# Patient Record
Sex: Female | Born: 1990 | Race: White | Hispanic: No | Marital: Married | Smoking: Never smoker
Health system: Southern US, Community
[De-identification: ages and names within clinical notes are randomized; demographics above are authoritative.]

## PROBLEM LIST (undated history)

## (undated) ENCOUNTER — Inpatient Hospital Stay (HOSPITAL_COMMUNITY): Payer: Self-pay

## (undated) DIAGNOSIS — E119 Type 2 diabetes mellitus without complications: Secondary | ICD-10-CM

## (undated) HISTORY — PX: SKIN GRAFT: SHX250

---

## 2008-02-22 ENCOUNTER — Emergency Department (HOSPITAL_COMMUNITY): Admission: EM | Admit: 2008-02-22 | Discharge: 2008-02-22 | Payer: Self-pay | Admitting: Emergency Medicine

## 2016-05-17 ENCOUNTER — Inpatient Hospital Stay (HOSPITAL_COMMUNITY)
Admission: AD | Admit: 2016-05-17 | Discharge: 2016-05-17 | Disposition: A | Discharge status: Home / Self Care | Payer: Self-pay | Source: Ambulatory Visit | Attending: Obstetrics and Gynecology | Admitting: Obstetrics and Gynecology

## 2016-05-17 ENCOUNTER — Inpatient Hospital Stay (HOSPITAL_COMMUNITY): Payer: Self-pay

## 2016-05-17 ENCOUNTER — Encounter (HOSPITAL_COMMUNITY): Payer: Self-pay | Admitting: *Deleted

## 2016-05-17 DIAGNOSIS — O034 Incomplete spontaneous abortion without complication: Secondary | ICD-10-CM | POA: Insufficient documentation

## 2016-05-17 DIAGNOSIS — O209 Hemorrhage in early pregnancy, unspecified: Secondary | ICD-10-CM

## 2016-05-17 LAB — URINALYSIS, ROUTINE W REFLEX MICROSCOPIC
BILIRUBIN URINE: NEGATIVE
Glucose, UA: NEGATIVE mg/dL
KETONES UR: NEGATIVE mg/dL
LEUKOCYTES UA: NEGATIVE
NITRITE: NEGATIVE
Protein, ur: NEGATIVE mg/dL
SPECIFIC GRAVITY, URINE: 1.02 (ref 1.005–1.030)
pH: 6 (ref 5.0–8.0)

## 2016-05-17 LAB — URINE MICROSCOPIC-ADD ON

## 2016-05-17 LAB — POCT PREGNANCY, URINE: PREG TEST UR: POSITIVE — AB

## 2016-05-17 LAB — HCG, QUANTITATIVE, PREGNANCY: hCG, Beta Chain, Quant, S: 3656 m[IU]/mL — ABNORMAL HIGH (ref <5)

## 2016-05-17 NOTE — MAU Note (Signed)
Pt returned from U/S. Small gel like sac noted on pad  From U/s. Possible gestational sac. Dr. Cherly Hensenousins observed it and sent to pathology.

## 2016-05-17 NOTE — MAU Note (Signed)
Pt reports she started having some vaginal bleeding when she wiped an some spotting . reports some mild cramping as well.

## 2016-05-17 NOTE — MAU Provider Note (Signed)
History     Chief Complaint  Patient presents with  . Vaginal Bleeding   25 yo G1P0 MWF LMP 04/05/2016 (+) SPT 6 week amenorrhea here for evaluation of vaginal bleeding that started today. Mild cramping.   OB History    Gravida Para Term Preterm AB Living   1             SAB TAB Ectopic Multiple Live Births                  Past Medical History:  Diagnosis Date  . Medical history non-contributory     Past Surgical History:  Procedure Laterality Date  . SKIN GRAFT     mouth    No family history on file.  Social History  Substance Use Topics  . Smoking status: Never Smoker  . Smokeless tobacco: Never Used  . Alcohol use No    Allergies: Allergies not on file  No prescriptions prior to admission.     Physical Exam   Blood pressure 132/73, pulse 99, temperature 99.1 F (37.3 C), resp. rate 18, height 5\' 5"  (1.651 m), weight 51.3 kg (113 lb), last menstrual period 04/05/2016.  General appearance: alert, cooperative and no distress Lungs: clear to auscultation bilaterally Heart: regular rate and rhythm Abdomen: soft, non-tender; bowel sounds normal; no masses,  no organomegaly Pelvic: cervix normal in appearance, external genitalia normal, no adnexal masses or tenderness, no bladder tenderness, uterus normal size, shape, and consistency and cervix closed, small amount of blood in vault Extremities: no edema, redness or tenderness in the calves or thighs Skin: Skin color, texture, turgor normal. No rashes or lesions ED Course  Vaginal bleeding In pregnancy P) Ob sonogram . Blood type MDM   Ileana Chalupa A, MD 7:34 PM 05/17/2016  Addendum: pt returned from sonogram having passed ? Gest sac and having had increased bleeding in the U/s dept.  Blood type A positive sono done: US Ob Comp Less 14 Wks  Result Date: 05/17/2016 CLINICAL DATA:  Vaginal bleeding. Estimated gestational age per LMP 6 weeks 0 days. No pregnancy test or quantitative beta HCG reported.  EXAM: OBSTETRIC <14 WK [place] AND TRANSVAGINAL OB US TECHNIQUE: Both transabdominal and transvaginal ultrasound examinations were performed for complete evaluation of the gestation as well as the maternal uterus, adnexal regions, and pelvic cul-de-sac. Transvaginal technique was performed to assess early pregnancy. COMPARISON:  None. FINDINGS: Intrauterine gestational sac: Not visualized. Yolk sac:  Not visualized. Embryo:  Not visualized. Cardiac Activity: Not visualized. Heart Rate: Not visualized. MSD: Not visualized. CRL:  Not visualized. Subchorionic hemorrhage:  None visualized. Maternal uterus/adnexae: Uterus is normal in size, shape and position as the endometrium measures 12.2 mm in thickness. There is moderate mobile heterogeneous debris within the endometrial cavity extending to the endocervical canal likely hemorrhagic debris. Patient passed moderate clots between the transabdominal and endovaginal scanning. No free pelvic fluid. Ovaries are within normal with normal color Doppler flow. IMPRESSION: No evidence of intrauterine gestation. Moderate heterogeneous mobile debris within the endometrial and endocervical canal compatible with hemorrhagic debris. Normal ovaries.  No free fluid. Electronically Signed   By: Elberta Fortis M.D.   On: 05/17/2016 20:10   US Ob Transvaginal  Result Date: 05/17/2016 CLINICAL DATA:  Vaginal bleeding. Estimated gestational age per LMP 6 weeks 0 days. No pregnancy test or quantitative beta HCG reported. EXAM: OBSTETRIC <14 WK [place] AND TRANSVAGINAL OB US TECHNIQUE: Both transabdominal and transvaginal ultrasound examinations were performed for complete evaluation of the gestation  as well as the maternal uterus, adnexal regions, and pelvic cul-de-sac. Transvaginal technique was performed to assess early pregnancy. COMPARISON:  None. FINDINGS: Intrauterine gestational sac: Not visualized. Yolk sac:  Not visualized. Embryo:  Not visualized. Cardiac Activity: Not visualized. Heart  Rate: Not visualized. MSD: Not visualized. CRL:  Not visualized. Subchorionic hemorrhage:  None visualized. Maternal uterus/adnexae: Uterus is normal in size, shape and position as the endometrium measures 12.2 mm in thickness. There is moderate mobile heterogeneous debris within the endometrial cavity extending to the endocervical canal likely hemorrhagic debris. Patient passed moderate clots between the transabdominal and endovaginal scanning. No free pelvic fluid. Ovaries are within normal with normal color Doppler flow. IMPRESSION: No evidence of intrauterine gestation. Moderate heterogeneous mobile debris within the endometrial and endocervical canal compatible with hemorrhagic debris. Normal ovaries.  No free fluid. Electronically Signed   By: Elberta Fortisaniel  Boyle M.D.   On: 05/17/2016 20:10    Will add hquant.  Specimen sent to pathology counseled on SAB precautions. To expect more bleeding Will notify of Hquant result. Disc abstaining and following up in office. Heartstring info given D/c home

## 2016-05-18 LAB — ABO/RH: ABO/RH(D): A POS

## 2016-09-01 NOTE — L&D Delivery Note (Signed)
Delivery Note At 11:13 AM a viable and healthy female was delivered via  (Presentation: ;LOA  ).  APGAR: 8, 9; weight pending .   Placenta status: spontaneious, intact.  Cord:  with the following complications: none.  Cord pH: na  Anesthesia:  epidural Episiotomy:  na Lacerations:  second Suture Repair: 2.0 3.0 vicryl rapide Est. Blood Loss (mL):  300  Mom to postpartum.  Baby to Couplet care / Skin to Skin.  Martha Delgado J 03/23/2017, 12:21 PM

## 2016-09-19 LAB — OB RESULTS CONSOLE RUBELLA ANTIBODY, IGM: RUBELLA: IMMUNE

## 2016-09-19 LAB — OB RESULTS CONSOLE GC/CHLAMYDIA
Chlamydia: NEGATIVE
Gonorrhea: NEGATIVE

## 2016-09-19 LAB — OB RESULTS CONSOLE HIV ANTIBODY (ROUTINE TESTING): HIV: NONREACTIVE

## 2016-09-19 LAB — OB RESULTS CONSOLE ABO/RH: RH TYPE: POSITIVE

## 2016-09-19 LAB — OB RESULTS CONSOLE RPR: RPR: NONREACTIVE

## 2016-09-19 LAB — OB RESULTS CONSOLE HEPATITIS B SURFACE ANTIGEN: HEP B S AG: NEGATIVE

## 2016-09-19 LAB — OB RESULTS CONSOLE ANTIBODY SCREEN: Antibody Screen: NEGATIVE

## 2017-01-14 ENCOUNTER — Ambulatory Visit: Payer: Self-pay

## 2017-02-24 LAB — OB RESULTS CONSOLE GBS: GBS: NEGATIVE

## 2017-03-17 ENCOUNTER — Encounter (HOSPITAL_COMMUNITY): Payer: Self-pay | Admitting: *Deleted

## 2017-03-17 ENCOUNTER — Telehealth (HOSPITAL_COMMUNITY): Payer: Self-pay | Admitting: *Deleted

## 2017-03-17 ENCOUNTER — Other Ambulatory Visit: Payer: Self-pay | Admitting: Obstetrics

## 2017-03-17 NOTE — Telephone Encounter (Signed)
Preadmission screen  

## 2017-03-18 ENCOUNTER — Encounter (HOSPITAL_COMMUNITY): Payer: Self-pay | Admitting: *Deleted

## 2017-03-18 ENCOUNTER — Telehealth (HOSPITAL_COMMUNITY): Payer: Self-pay | Admitting: *Deleted

## 2017-03-18 NOTE — Telephone Encounter (Signed)
Preadmission screen  

## 2017-03-22 ENCOUNTER — Encounter (HOSPITAL_COMMUNITY): Payer: Self-pay

## 2017-03-23 ENCOUNTER — Inpatient Hospital Stay (HOSPITAL_COMMUNITY): Payer: Self-pay | Admitting: Anesthesiology

## 2017-03-23 ENCOUNTER — Encounter (HOSPITAL_COMMUNITY): Payer: Self-pay | Admitting: *Deleted

## 2017-03-23 ENCOUNTER — Inpatient Hospital Stay (HOSPITAL_COMMUNITY)
Admission: RE | Admit: 2017-03-23 | Discharge: 2017-03-24 | DRG: 775 | Disposition: A | Discharge status: Home / Self Care | Payer: Self-pay | Source: Ambulatory Visit | Attending: Obstetrics and Gynecology | Admitting: Obstetrics and Gynecology

## 2017-03-23 DIAGNOSIS — Z880 Allergy status to penicillin: Secondary | ICD-10-CM

## 2017-03-23 DIAGNOSIS — O24410 Gestational diabetes mellitus in pregnancy, diet controlled: Secondary | ICD-10-CM | POA: Diagnosis present

## 2017-03-23 DIAGNOSIS — O480 Post-term pregnancy: Secondary | ICD-10-CM | POA: Diagnosis present

## 2017-03-23 DIAGNOSIS — Z3490 Encounter for supervision of normal pregnancy, unspecified, unspecified trimester: Secondary | ICD-10-CM | POA: Diagnosis present

## 2017-03-23 DIAGNOSIS — Z3A40 40 weeks gestation of pregnancy: Secondary | ICD-10-CM

## 2017-03-23 DIAGNOSIS — D509 Iron deficiency anemia, unspecified: Secondary | ICD-10-CM | POA: Diagnosis present

## 2017-03-23 DIAGNOSIS — O9902 Anemia complicating childbirth: Secondary | ICD-10-CM | POA: Diagnosis present

## 2017-03-23 DIAGNOSIS — O24420 Gestational diabetes mellitus in childbirth, diet controlled: Principal | ICD-10-CM | POA: Diagnosis present

## 2017-03-23 HISTORY — DX: Type 2 diabetes mellitus without complications: E11.9

## 2017-03-23 LAB — GLUCOSE, CAPILLARY
GLUCOSE-CAPILLARY: 84 mg/dL (ref 65–99)
GLUCOSE-CAPILLARY: 95 mg/dL (ref 65–99)

## 2017-03-23 LAB — CBC
HEMATOCRIT: 37.4 % (ref 36.0–46.0)
HEMOGLOBIN: 13.1 g/dL (ref 12.0–15.0)
MCH: 32.1 pg (ref 26.0–34.0)
MCHC: 35 g/dL (ref 30.0–36.0)
MCV: 91.7 fL (ref 78.0–100.0)
Platelets: 205 10*3/uL (ref 150–400)
RBC: 4.08 MIL/uL (ref 3.87–5.11)
RDW: 13.6 % (ref 11.5–15.5)
WBC: 8.1 10*3/uL (ref 4.0–10.5)

## 2017-03-23 LAB — TYPE AND SCREEN
ABO/RH(D): A POS
ANTIBODY SCREEN: NEGATIVE

## 2017-03-23 MED ORDER — EPHEDRINE 5 MG/ML INJ: 10.0000 mg | INTRAVENOUS | Status: DC | PRN

## 2017-03-23 MED ORDER — PHENYLEPHRINE 40 MCG/ML (10ML) SYRINGE FOR IV PUSH (FOR BLOOD PRESSURE SUPPORT)
80.0000 ug | PREFILLED_SYRINGE | INTRAVENOUS | Status: DC | PRN
  Filled 2017-03-23: qty 5

## 2017-03-23 MED ORDER — LIDOCAINE HCL (PF) 1 % IJ SOLN
30.0000 mL | INTRAMUSCULAR | Status: DC | PRN
  Filled 2017-03-23: qty 30

## 2017-03-23 MED ORDER — FENTANYL 2.5 MCG/ML BUPIVACAINE 1/10 % EPIDURAL INFUSION (WH - ANES)
INTRAMUSCULAR | Status: AC
  Filled 2017-03-23: qty 100

## 2017-03-23 MED ORDER — OXYCODONE-ACETAMINOPHEN 5-325 MG PO TABS: 1.0000 | ORAL_TABLET | ORAL | Status: DC | PRN

## 2017-03-23 MED ORDER — LACTATED RINGERS IV SOLN: 500.0000 mL | INTRAVENOUS | Status: DC | PRN

## 2017-03-23 MED ORDER — PRENATAL MULTIVITAMIN CH
1.0000 | ORAL_TABLET | Freq: Every day | ORAL | Status: DC
  Administered 2017-03-24: 1 via ORAL
  Filled 2017-03-23: qty 1

## 2017-03-23 MED ORDER — ACETAMINOPHEN 325 MG PO TABS: 650.0000 mg | ORAL_TABLET | ORAL | Status: DC | PRN

## 2017-03-23 MED ORDER — METHYLERGONOVINE MALEATE 0.2 MG/ML IJ SOLN: 0.2000 mg | INTRAMUSCULAR | Status: DC | PRN

## 2017-03-23 MED ORDER — MISOPROSTOL 25 MCG QUARTER TABLET
25.0000 ug | ORAL_TABLET | ORAL | Status: DC | PRN
  Administered 2017-03-23: 25 ug via VAGINAL
  Filled 2017-03-23 (×2): qty 1

## 2017-03-23 MED ORDER — FENTANYL 2.5 MCG/ML BUPIVACAINE 1/10 % EPIDURAL INFUSION (WH - ANES)
14.0000 mL/h | INTRAMUSCULAR | Status: DC | PRN
  Administered 2017-03-23: 14 mL/h via EPIDURAL

## 2017-03-23 MED ORDER — LACTATED RINGERS IV SOLN: 500.0000 mL | Freq: Once | INTRAVENOUS | Status: DC

## 2017-03-23 MED ORDER — OXYCODONE-ACETAMINOPHEN 5-325 MG PO TABS: 2.0000 | ORAL_TABLET | ORAL | Status: DC | PRN

## 2017-03-23 MED ORDER — EPHEDRINE 5 MG/ML INJ
INTRAVENOUS | Status: AC
  Filled 2017-03-23: qty 4

## 2017-03-23 MED ORDER — COCONUT OIL OIL
1.0000 "application " | TOPICAL_OIL | Status: DC | PRN
  Administered 2017-03-24: 1 via TOPICAL
  Filled 2017-03-23: qty 120

## 2017-03-23 MED ORDER — ZOLPIDEM TARTRATE 5 MG PO TABS: 5.0000 mg | ORAL_TABLET | Freq: Every evening | ORAL | Status: DC | PRN

## 2017-03-23 MED ORDER — LIDOCAINE HCL (PF) 1 % IJ SOLN
INTRAMUSCULAR | Status: DC | PRN
  Administered 2017-03-23 (×2): 5 mL via EPIDURAL

## 2017-03-23 MED ORDER — SENNOSIDES-DOCUSATE SODIUM 8.6-50 MG PO TABS
2.0000 | ORAL_TABLET | ORAL | Status: DC
  Administered 2017-03-24: 2 via ORAL
  Filled 2017-03-23: qty 2

## 2017-03-23 MED ORDER — ONDANSETRON HCL 4 MG/2ML IJ SOLN: 4.0000 mg | Freq: Four times a day (QID) | INTRAMUSCULAR | Status: DC | PRN

## 2017-03-23 MED ORDER — BENZOCAINE-MENTHOL 20-0.5 % EX AERO
1.0000 "application " | INHALATION_SPRAY | CUTANEOUS | Status: DC | PRN
  Administered 2017-03-23: 1 via TOPICAL
  Filled 2017-03-23: qty 56

## 2017-03-23 MED ORDER — OXYTOCIN BOLUS FROM INFUSION
500.0000 mL | Freq: Once | INTRAVENOUS | Status: AC
  Administered 2017-03-23: 500 mL via INTRAVENOUS

## 2017-03-23 MED ORDER — ONDANSETRON HCL 4 MG/2ML IJ SOLN: 4.0000 mg | INTRAMUSCULAR | Status: DC | PRN

## 2017-03-23 MED ORDER — EPHEDRINE 5 MG/ML INJ
10.0000 mg | INTRAVENOUS | Status: DC | PRN
  Filled 2017-03-23: qty 2

## 2017-03-23 MED ORDER — SIMETHICONE 80 MG PO CHEW: 80.0000 mg | CHEWABLE_TABLET | ORAL | Status: DC | PRN

## 2017-03-23 MED ORDER — DIPHENHYDRAMINE HCL 50 MG/ML IJ SOLN: 12.5000 mg | INTRAMUSCULAR | Status: DC | PRN

## 2017-03-23 MED ORDER — PHENYLEPHRINE 40 MCG/ML (10ML) SYRINGE FOR IV PUSH (FOR BLOOD PRESSURE SUPPORT): 80.0000 ug | PREFILLED_SYRINGE | INTRAVENOUS | Status: DC | PRN

## 2017-03-23 MED ORDER — LACTATED RINGERS IV SOLN
INTRAVENOUS | Status: DC
  Administered 2017-03-23 (×2): via INTRAVENOUS

## 2017-03-23 MED ORDER — LACTATED RINGERS IV SOLN
500.0000 mL | Freq: Once | INTRAVENOUS | Status: AC
  Administered 2017-03-23: 500 mL via INTRAVENOUS

## 2017-03-23 MED ORDER — PHENYLEPHRINE 40 MCG/ML (10ML) SYRINGE FOR IV PUSH (FOR BLOOD PRESSURE SUPPORT)
PREFILLED_SYRINGE | INTRAVENOUS | Status: AC
  Filled 2017-03-23: qty 20

## 2017-03-23 MED ORDER — ONDANSETRON HCL 4 MG PO TABS: 4.0000 mg | ORAL_TABLET | ORAL | Status: DC | PRN

## 2017-03-23 MED ORDER — DIPHENHYDRAMINE HCL 25 MG PO CAPS: 25.0000 mg | ORAL_CAPSULE | Freq: Four times a day (QID) | ORAL | Status: DC | PRN

## 2017-03-23 MED ORDER — IBUPROFEN 600 MG PO TABS
600.0000 mg | ORAL_TABLET | Freq: Four times a day (QID) | ORAL | Status: DC
  Administered 2017-03-23 – 2017-03-24 (×5): 600 mg via ORAL
  Filled 2017-03-23 (×5): qty 1

## 2017-03-23 MED ORDER — DIBUCAINE 1 % RE OINT: 1.0000 "application " | TOPICAL_OINTMENT | RECTAL | Status: DC | PRN

## 2017-03-23 MED ORDER — TERBUTALINE SULFATE 1 MG/ML IJ SOLN
0.2500 mg | Freq: Once | INTRAMUSCULAR | Status: DC | PRN
  Filled 2017-03-23: qty 1

## 2017-03-23 MED ORDER — METHYLERGONOVINE MALEATE 0.2 MG PO TABS: 0.2000 mg | ORAL_TABLET | ORAL | Status: DC | PRN

## 2017-03-23 MED ORDER — FLEET ENEMA 7-19 GM/118ML RE ENEM: 1.0000 | ENEMA | RECTAL | Status: DC | PRN

## 2017-03-23 MED ORDER — OXYTOCIN 40 UNITS IN LACTATED RINGERS INFUSION - SIMPLE MED
2.5000 [IU]/h | INTRAVENOUS | Status: DC
  Administered 2017-03-23: 2.5 [IU]/h via INTRAVENOUS
  Filled 2017-03-23: qty 1000

## 2017-03-23 MED ORDER — WITCH HAZEL-GLYCERIN EX PADS: 1.0000 "application " | MEDICATED_PAD | CUTANEOUS | Status: DC | PRN

## 2017-03-23 MED ORDER — SOD CITRATE-CITRIC ACID 500-334 MG/5ML PO SOLN: 30.0000 mL | ORAL | Status: DC | PRN

## 2017-03-23 MED ORDER — TETANUS-DIPHTH-ACELL PERTUSSIS 5-2.5-18.5 LF-MCG/0.5 IM SUSP
0.5000 mL | Freq: Once | INTRAMUSCULAR | Status: AC
  Administered 2017-03-24: 0.5 mL via INTRAMUSCULAR
  Filled 2017-03-23: qty 0.5

## 2017-03-23 NOTE — Progress Notes (Signed)
UR chart review completed.  

## 2017-03-23 NOTE — Anesthesia Pain Management Evaluation Note (Signed)
  CRNA Pain Management Visit Note  Patient: Martha Delgado, 26 y.o., female  "Hello I am a member of the anesthesia team at [facility]. We have an anesthesia team available at all times to provide care throughout the hospital, including epidural management and anesthesia for C-section. I don't know your plan for the delivery whether it a natural birth, water birth, IV sedation, nitrous supplementation, doula or epidural, but we want to meet your pain goals."   1.Was your pain managed to your expectations on prior hospitalizations?   No prior hospitalizations  2.What is your expectation for pain management during this hospitalization?     Labor support without medications, Epidural and IV pain meds  3.How can we help you reach that goal? Pt open to discussion about all pain control methods.  Record the patient's initial score and the patient's pain goal.   Pain: 7  Pain Goal: 10 [facility] wants you to be able to say your pain was always managed very well.  Elhadj Girton 03/23/2017

## 2017-03-23 NOTE — H&P (Signed)
Martha Delgado is a 26 y.o. G2P0 at 2678w5d presenting for IOL given A1GDM, now past EDC. Pt notes occasional mild contractions. Good fetal movement, No vaginal bleeding, not leaking fluid.  [facility] 1st [facility]. - GDM, A1, Failed 1 and 3 hr GTT with normal BS testing, no meds needed. Pt declined 3rd trimester fetal testing. - Self Pay - low maternal wt gain  Prenatal Transfer Tool  Maternal Diabetes: Yes:  Diabetes Type:  Diet controlled Genetic Screening: Declined Maternal Ultrasounds/Referrals: Normal Fetal Ultrasounds or other Referrals:  None Maternal Substance Abuse:  No Significant Maternal Medications:  None Significant Maternal Lab Results: None     OB History    Gravida Para Term Preterm AB Living   2             SAB TAB Ectopic Multiple Live Births                 Past Medical History:  Diagnosis Date  . Diabetes mellitus without complication (HCC)    GDM-diet control   Past Surgical History:  Procedure Laterality Date  . SKIN GRAFT     mouth   Family History: family history includes Heart attack in her maternal grandmother; Hyperlipidemia in her maternal grandmother; Kidney disease in her maternal grandmother; Stroke in her maternal grandmother; Thyroid disease in her maternal grandmother. Social History:  reports that she has never smoked. She has never used smokeless tobacco. She reports that she does not drink alcohol or use drugs.  Review of Systems - Negative except discomfort of pregnancy   Dilation: Fingertip Effacement (%): 60 Station: -1 Exam by:: A. Tuttle, RNC  Blood pressure (!) 118/59, pulse 72, temperature 98 F (36.7 C), temperature source Oral, resp. rate 18, height 5\' 5"  (1.651 m), weight 59 kg (130 lb), last menstrual period 04/05/2016, unknown if currently breastfeeding.  Physical Exam:  Deferred, overnight admit  Prenatal labs: ABO, Rh: --/--/A POS (07/23 0040) Antibody: NEG (07/23 0040) Rubella: !Error!  immune RPR: Nonreactive (01/19 0000)  HBsAg: Negative (01/19 0000)  HIV: Non-reactive (01/19 0000)  GBS: Negative (06/26 0000)  1 hr Glucola 142  Genetic screening declined, nl AFP, nl anatomy u/s Anatomy US normal   Assessment/Plan: 26 y.o. G2P0 at 5178w5d A1GDM, plan IOL now that past EDC IOL, cytotec x 2 then pitocin, AROM when favorable GDM, check BS when in active labor.    Martha Delgado A. 03/23/2017, 7:40 AM

## 2017-03-23 NOTE — Anesthesia Preprocedure Evaluation (Signed)
Anesthesia Evaluation  Patient identified by MRN, date of birth, ID band Patient awake    Reviewed: Allergy & Precautions, H&P , NPO status , Patient's Chart, lab work & pertinent test results  Airway Mallampati: II   Neck ROM: full    Dental   Pulmonary neg pulmonary ROS,    breath sounds clear to auscultation       Cardiovascular negative cardio ROS   Rhythm:regular Rate:Normal     Neuro/Psych    GI/Hepatic   Endo/Other  diabetes, Gestational  Renal/GU      Musculoskeletal   Abdominal   Peds  Hematology   Anesthesia Other Findings   Reproductive/Obstetrics (+) Pregnancy                             Anesthesia Physical Anesthesia Plan  ASA: I  Anesthesia Plan: Epidural   Post-op Pain Management:    Induction: Intravenous  PONV Risk Score and Plan: 2 and Treatment may vary due to age or medical condition  Airway Management Planned: Natural Airway  Additional Equipment:   Intra-op Plan:   Post-operative Plan:   Informed Consent: I have reviewed the patients History and Physical, chart, labs and discussed the procedure including the risks, benefits and alternatives for the proposed anesthesia with the patient or authorized representative who has indicated his/her understanding and acceptance.     Plan Discussed with: Anesthesiologist  Anesthesia Plan Comments:         Anesthesia Quick Evaluation

## 2017-03-23 NOTE — Anesthesia Postprocedure Evaluation (Signed)
Anesthesia Post Note  Patient: Martha Delgado  Procedure(s) Performed: * No procedures listed *     Patient location during evaluation: Mother Baby Anesthesia Type: Epidural Level of consciousness: awake and alert and oriented Pain management: pain level controlled Vital Signs Assessment: post-procedure vital signs reviewed and stable Respiratory status: spontaneous breathing and nonlabored ventilation Cardiovascular status: stable Postop Assessment: no headache, patient able to bend at knees, no backache, no signs of nausea or vomiting, epidural receding and adequate PO intake Anesthetic complications: no    Last Vitals:  Vitals:   03/23/17 1250 03/23/17 1310  BP: 118/63 108/63  Pulse: 81 73  Resp:  16  Temp:  36.6 C    Last Pain:  Vitals:   03/23/17 1310  TempSrc: Oral  PainSc:    Pain Goal:                 [name],Martha Delgado

## 2017-03-23 NOTE — Lactation Note (Signed)
This note was copied from a baby's chart. Lactation Consultation Note  Patient Name: Martha Delgado ZOXWR'UToday's Date: 03/23/2017 Reason for consult: Initial assessment   Initial assessment with first time mom of term infant at 35 hours old . Asked to see mom by Misty StanleyLisa, RN die to biting and clenching at the breast. Mom had [facility] lat[facility] and [facility]. She reports the latch felt much better. Enc mom to perform suck training prior to latch. Mom with GDM-diet controlled. Infant CBG's WNL.   Enc mom to BF infant STS 8-12 x in 24 hours at first feeding cues, offering both breasts with each feeding allowing infant to feed as long as she wants. Enc mom to massage/compress breast with feeding and to stimulate infant as needed with feeding. Infant temperature is low, enc mom to keep infant STS after feeding to assist with temperature.   Reviewed BF basics, pillow support, hand expression, colostrum, milk coming to volume, NB feeding behaviors, NB nutritional needs and cluster feeding. Enc mom to call out to desk for feeding assistance as needed.   BF resources Handout and LC Brochure given, mom informed of IP/OP Services, BF Support Groups and LC phone #. Mom has an Evenflo pump for home use. Mom without questions/concerns at this time.     Maternal Data Formula Feeding for Exclusion: No Has patient been taught Hand Expression?: Yes Does the patient have breastfeeding experience prior to this delivery?: No  Feeding Feeding Type: Breast Fed Length of feed: 15 min (still feeding when LC entered and left room)  LATCH Score/Interventions Latch: Grasps breast easily, tongue down, lips flanged, rhythmical sucking.  Audible Swallowing: A few with stimulation Intervention(s): Skin to skin;Hand expression;Alternate breast massage     Comfort (Breast/Nipple): Soft / non-tender     Hold (Positioning): No assistance needed to correctly position infant at breast.     Lactation Tools  Discussed/Used [facility]: No   Consult Status Consult Status: Follow-up Date: 03/24/17 Follow-up type: In-patient    Silas FloodSharon S Charolett Yarrow 03/23/2017, 4:43 PM

## 2017-03-23 NOTE — Anesthesia Procedure Notes (Signed)
Epidural Patient location during procedure: OB Start time: 03/23/2017 8:23 AM End time: 03/23/2017 8:34 AM  Staffing Anesthesiologist: Chaney MallingHODIERNE, Karder Goodin Performed: anesthesiologist   Preanesthetic Checklist Completed: patient identified, site marked, pre-op evaluation, timeout performed, IV checked, risks and benefits discussed and monitors and equipment checked  Epidural Patient position: sitting Prep: DuraPrep Patient monitoring: heart rate, cardiac monitor, continuous pulse ox and blood pressure Approach: midline Location: L2-L3 Injection technique: LOR saline  Needle:  Needle type: Tuohy  Needle gauge: 17 G Needle length: 9 cm Needle insertion depth: 4 cm Catheter type: closed end flexible Catheter size: 19 Gauge Catheter at skin depth: 10 cm Test dose: negative and Other  Assessment Events: blood not aspirated, injection not painful, no injection resistance and negative IV test  Additional Notes Informed consent obtained prior to proceeding including risk of failure, 1% risk of PDPH, risk of minor discomfort and bruising.  Discussed rare but serious complications including epidural abscess, permanent nerve injury, epidural hematoma.  Discussed alternatives to epidural analgesia and patient desires to proceed.  Timeout performed pre-procedure verifying patient name, procedure, and platelet count.  Patient tolerated procedure well. Reason for block:procedure for pain

## 2017-03-24 ENCOUNTER — Encounter (HOSPITAL_COMMUNITY): Payer: Self-pay

## 2017-03-24 DIAGNOSIS — O24410 Gestational diabetes mellitus in pregnancy, diet controlled: Secondary | ICD-10-CM | POA: Diagnosis present

## 2017-03-24 LAB — CBC
HCT: 34.2 % — ABNORMAL LOW (ref 36.0–46.0)
Hemoglobin: 11.4 g/dL — ABNORMAL LOW (ref 12.0–15.0)
MCH: 31.3 pg (ref 26.0–34.0)
MCHC: 33.3 g/dL (ref 30.0–36.0)
MCV: 94 fL (ref 78.0–100.0)
PLATELETS: 178 10*3/uL (ref 150–400)
RBC: 3.64 MIL/uL — AB (ref 3.87–5.11)
RDW: 13.9 % (ref 11.5–15.5)
WBC: 11.6 10*3/uL — AB (ref 4.0–10.5)

## 2017-03-24 LAB — RPR: RPR Ser Ql: NONREACTIVE

## 2017-03-24 MED ORDER — IBUPROFEN 600 MG PO TABS: 600.0000 mg | ORAL_TABLET | Freq: Four times a day (QID) | ORAL | 0 refills | Status: AC

## 2017-03-24 NOTE — Progress Notes (Addendum)
PPD #1, SVD, 2nd degree vaginal laceration, baby girl "Gralyn"  S:  Reports feeling good, got some rest overnight, desires an early discharge home today - will discuss with peds  Reports some slight discomfort from repair             Tolerating po/ No nausea or vomiting / Denies dizziness or SOB             Bleeding is light             Pain controlled with Motrin             Up ad lib / ambulatory / voiding QS  Newborn breast feeding - feels like it is going better, but still working with latch and positioning  O:               VS: BP 121/70 (BP Location: Left Arm)   Pulse 88   Temp 98.2 F (36.8 C) (Oral)   Resp 18   Ht 5\' 5"  (1.651 m)   Wt 59 kg (130 lb)   LMP 04/05/2016   SpO2 100%   Breastfeeding? Unknown   BMI 21.63 kg/m    LABS:              Recent Labs  03/23/17 0040 03/24/17 0554  WBC 8.1 11.6*  HGB 13.1 11.4*  PLT 205 178               Blood type: --/--/A POS (07/23 0040)  Rubella: Immune (01/19 0000)                     I&O: Intake/Output      07/23 0701 - 07/24 0700 07/24 0701 - 07/25 0700   Blood 300    Total Output 300     Net -300          Urine Occurrence 1 x                  Physical Exam:             Alert and oriented X3  Lungs: Clear and unlabored  Heart: regular rate and rhythm / no mumurs  Abdomen: soft, non-tender, non-distended              Fundus: firm, non-tender, U-E  Perineum: well approximated 2nd degree vaginal laceration, (+) significant labial edema noted, no erythema or evidence of hematoma  Lochia: appropriate, no clots   Extremities: trace BLE edema, no calf pain or tenderness    A: PPD # 1, SVD  2nd degree vaginal laceration   A1GDM - stable, delivered    Doing well - stable status  P: Routine post partum orders  Advised to continue using ice pack for labial edema  Will reassess for discharge after peds rounds on baby  See lactation today    D/C IV today  Continue current care   Carlean JewsMeredith Sigmon, MSN,  CNM [facility]

## 2017-03-24 NOTE — Discharge Summary (Signed)
Obstetric Discharge Summary   Patient Name: Martha Delgado DOB: [birth year] MRN: [id number]  Date of Admission: 03/23/2017 Date of Discharge: 03/24/2017 Date of Delivery: 03/23/17 Gestational Age at Delivery: [redacted]w[redacted]d  Primary OB: [facility] - Dr. Ernestina Penna   Antepartum complications:  - A1GDM - Low maternal weight gain - Maternal IDA Prenatal Labs:  ABO, Rh: --/--/A POS (07/23 0040) Antibody: NEG (07/23 0040) Rubella: !Error! immune RPR: Nonreactive (01/19 0000)  HBsAg: Negative (01/19 0000)  HIV: Non-reactive (01/19 0000)  GBS: Negative (06/26 0000)  1 hr Glucola 142          Genetic screening declined, nl AFP, nl anatomy u/s Anatomy US normal Admitting Diagnosis: IOL for A1GDM and postdates   Secondary Diagnoses: Patient Active Problem List   Diagnosis Date Noted  . GDM, class A1 03/24/2017  . Encounter for induction of labor 03/23/2017  . Postpartum care following vaginal delivery (7/23) 03/23/2017  . SVD (spontaneous vaginal delivery) 03/23/2017   Induction: Cytotec  Augmentation: none Complications: None  Date of Delivery: 03/23/17 Delivered By: Dr. Billy Coast Delivery Type: spontaneous vaginal delivery Anesthesia: epidural Placenta: sponatneous Laceration: 2nd degree vaginal  Episiotomy: none  Newborn Data: Live born female  Birth Weight: 7 lb 2.8 oz (3255 g) APGAR: 8, 9  Postpartum Course  (Vaginal Delivery): Patient had an uncomplicated postpartum course.  By time of discharge on PPD#1, her pain was controlled on oral pain medications; she had appropriate lochia and was ambulating, voiding without difficulty and tolerating regular diet.  She was deemed stable for discharge to home.    Labs: CBC Latest Ref Rng & Units 03/24/2017 03/23/2017  WBC 4.0 - 10.5 K/uL 11.6(H) 8.1  Hemoglobin 12.0 - 15.0 g/dL 11.4(L) 13.1  Hematocrit 36.0 - 46.0 % 34.2(L) 37.4  Platelets 150 - 400 K/uL 178 205   A POS  Physical exam:  BP 121/70 (BP Location: Left Arm)   Pulse 88    Temp 98.2 F (36.8 C) (Oral)   Resp 18   Ht 5\' 5"  (1.651 m)   Wt 59 kg (130 lb)   LMP 04/05/2016   SpO2 100%   Breastfeeding? Unknown   BMI 21.63 kg/m  General: alert and no distress Pulm: normal respiratory effort Lochia: appropriate Abdomen: soft, NT Uterine Fundus: firm, below umbilicus Perineum: healing well, no significant erythema, no significant edema Extremities: No evidence of DVT seen on physical exam. No lower extremity edema.  Disposition: stable, discharge to home Baby Feeding: breast milk Baby Disposition: home with mom  Contraception: natural family planning  Rh Immune globulin given: N/A Rubella vaccine given: N/A Tdap vaccine given in AP or PP setting: given 03/24/17 Flu vaccine given in AP or PP setting: none   Plan:  Festus Holts was discharged to home in good condition. Follow-up appointment at [facility] in 6 weeks. Will need 2 hour GTT.   Discharge Instructions: Per After Visit Summary. Activity: Advance as tolerated. Pelvic rest for 6 weeks.  Refer to After Visit Summary Diet: Regular, Heart Healthy Discharge Medications: Allergies as of 03/24/2017      Reactions   Penicillins Hives   Has patient had a PCN reaction causing immediate rash, facial/tongue/throat swelling, SOB or lightheadedness with hypotension: Yes Has patient had a PCN reaction causing severe rash involving mucus membranes or skin necrosis: Yes Has patient had a PCN reaction that required hospitalization: No Has patient had a PCN reaction occurring within the last 10 years: No If all of the above answers are "NO", then may  proceed with Cephalosporin use.      Medication List    STOP taking these medications   ferrous sulfate 325 (65 FE) MG tablet     TAKE these medications   ibuprofen 600 MG tablet Commonly known as:  ADVIL,MOTRIN Take 1 tablet (600 mg total) by mouth every 6 (six) hours.   prenatal multivitamin Tabs tablet Take 1 tablet by mouth daily at 12 noon.       Outpatient follow up:  Follow-up Information    Noland FordyceFogleman, Kelly, MD. Schedule an appointment as soon as possible for a visit in 6 week(s).   Specialty:  Obstetrics and Gynecology Why:  Postpartum visit.  Will need 2 hr GTT at 6 week visit Contact information: [address] [place] [id number] [phone]           Signed:  Carlean JewsMeredith Viva Gallaher, MSN, CNM [facility]

## 2018-02-27 IMAGING — US US OB COMP LESS 14 WK
1 series · 15 of 28 positions shown · non-contrast
Comparison: None.

CLINICAL DATA: Vaginal bleeding. Estimated gestational age per LMP
6 weeks 0 days. No pregnancy test or quantitative beta HCG reported.

EXAM:
OBSTETRIC <14 WK US AND TRANSVAGINAL OB US
TECHNIQUE: Both transabdominal and transvaginal ultrasound examinations were
performed for complete evaluation of the gestation as well as the
maternal uterus, adnexal regions, and pelvic cul-de-sac.
Transvaginal technique was performed to assess early pregnancy.

[Series 1: us ob comp less 14 wk · 28 acquisitions, 15 frames shown]
[im 1/28]
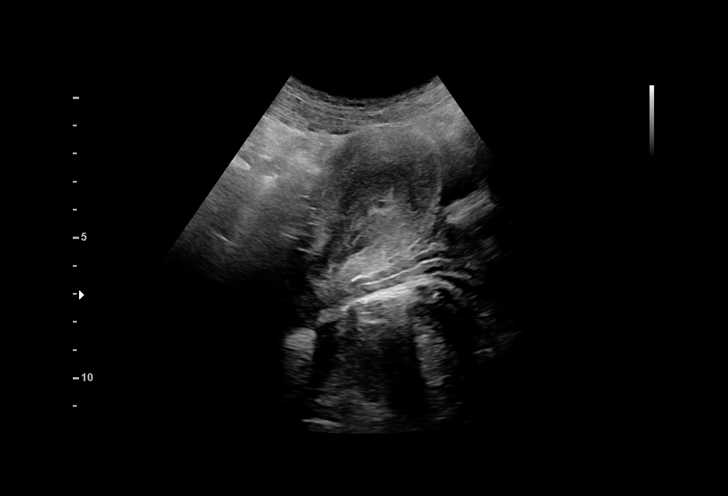
[im 3/28]
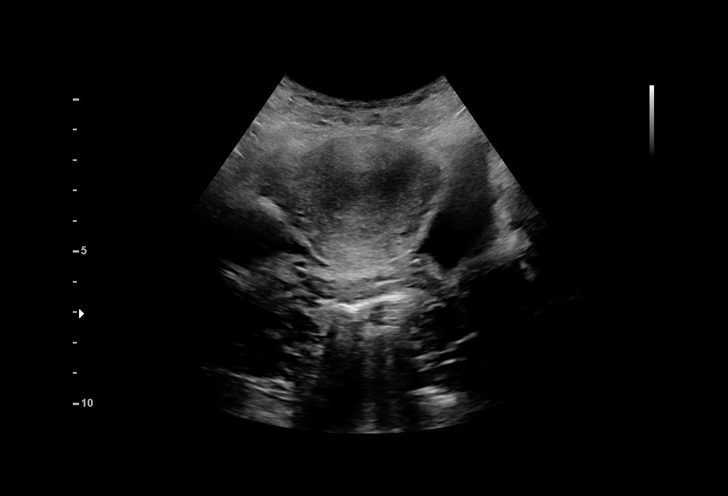
[im 5/28]
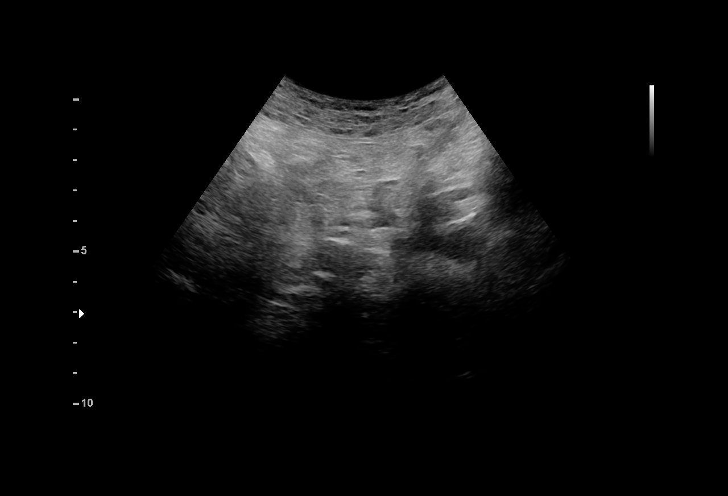
[im 7/28]
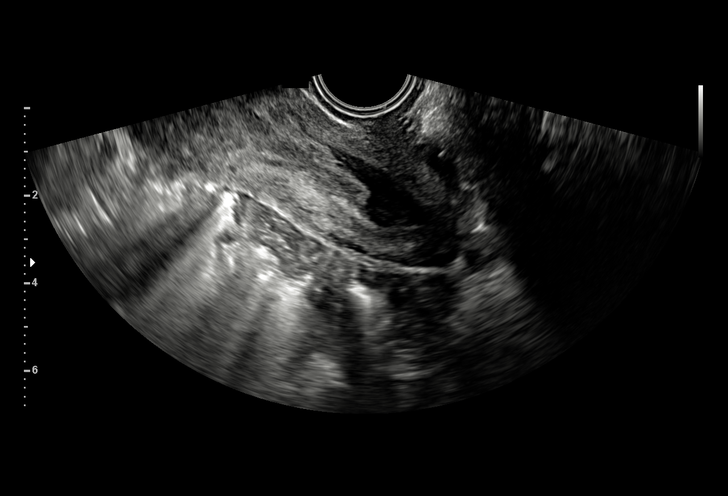
[im 9/28]
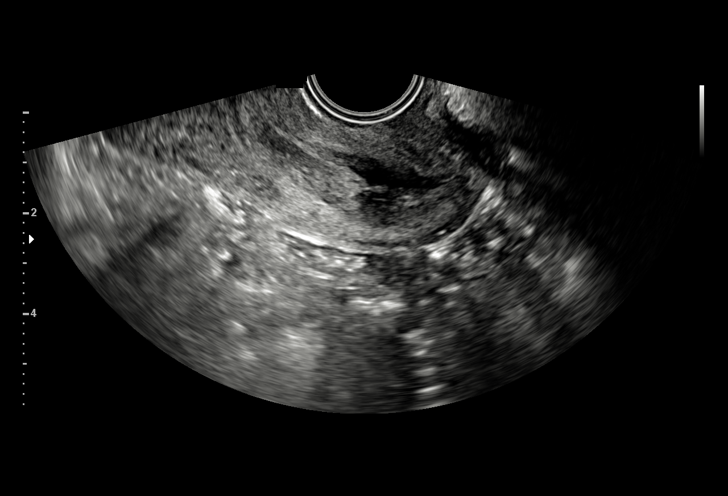
[im 11/28]
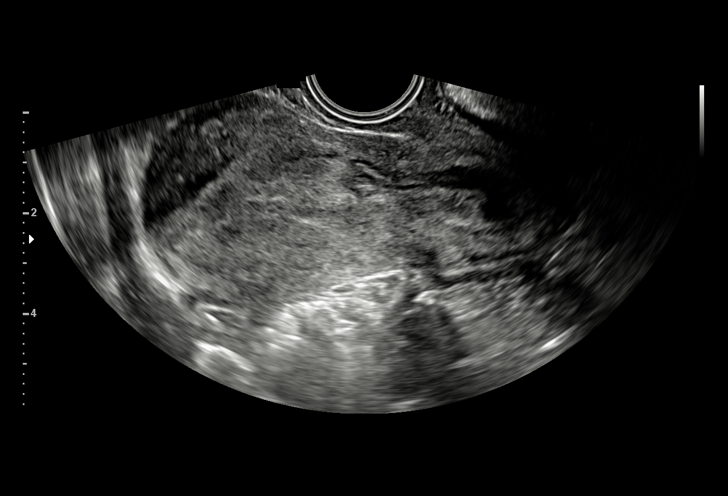
[im 13/28]
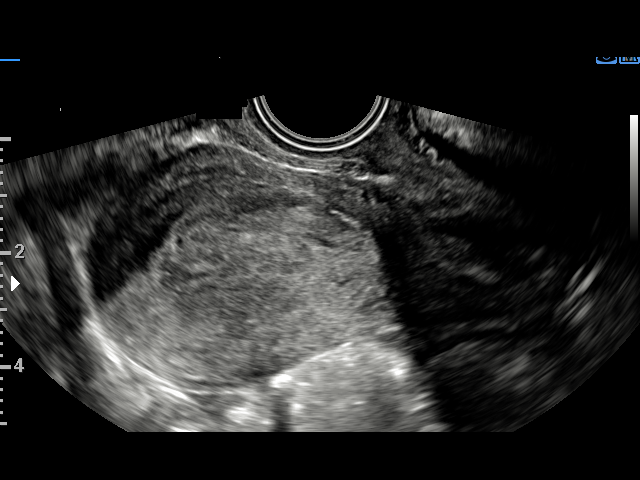
[im 15/28]
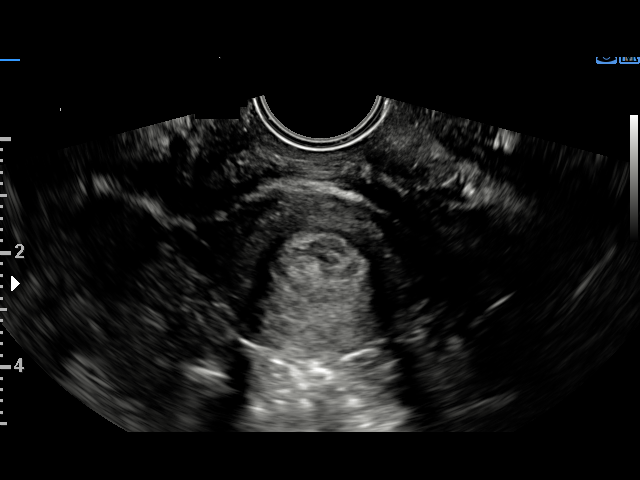
[im 16/28]
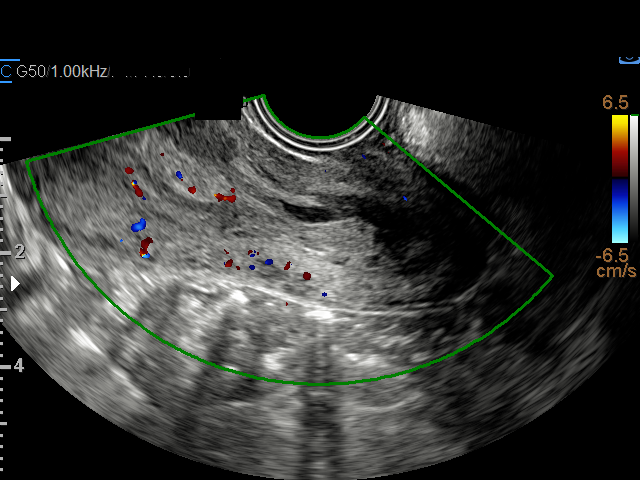
[im 18/28]
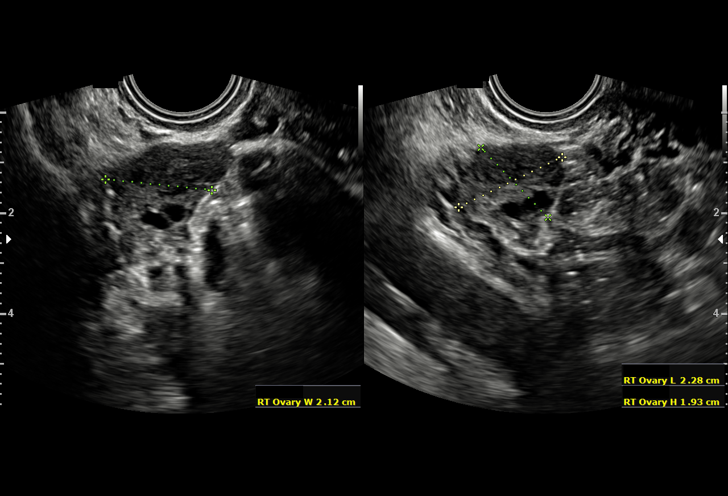
[im 20/28]
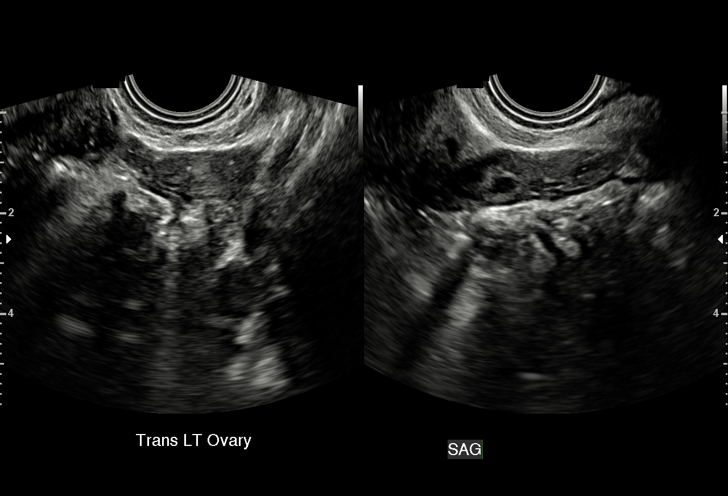
[im 22/28]
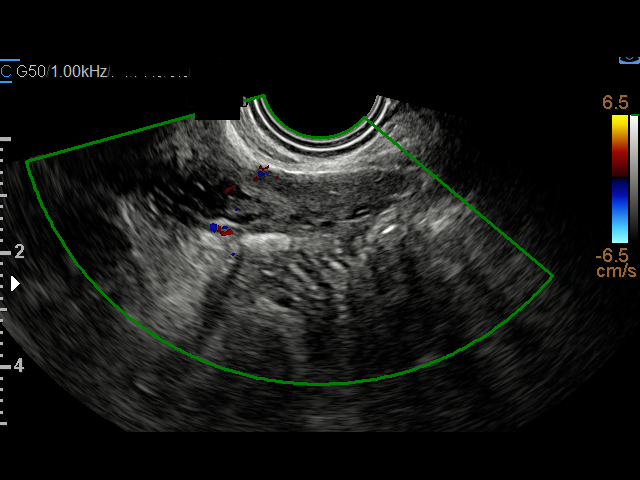
[im 24/28]
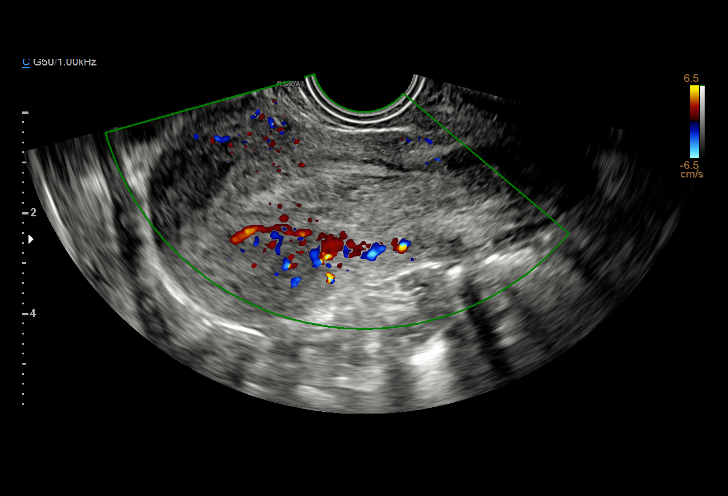
[im 26/28]
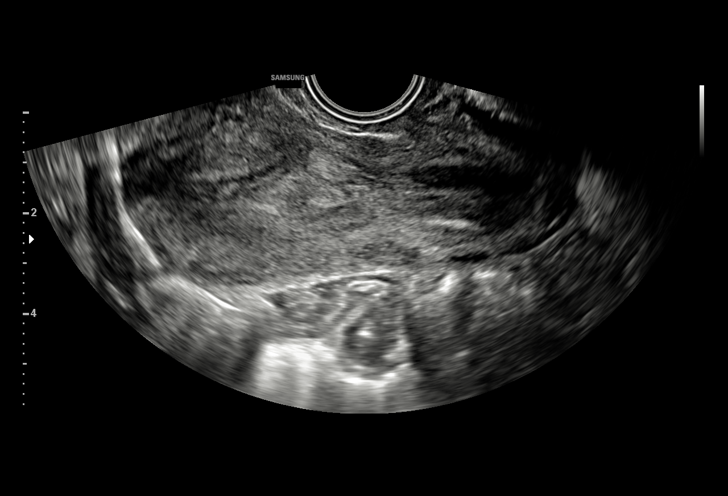
[im 28/28]
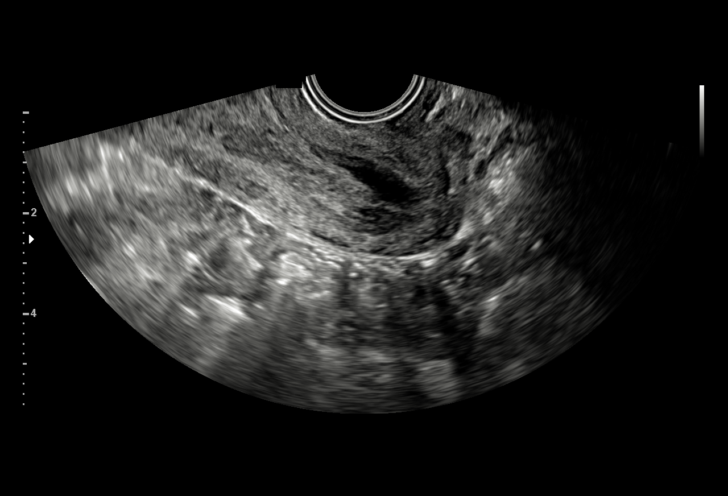

[15 of 28 positions shown; findings below may reference images not displayed]

FINDINGS: Intrauterine gestational sac: Not visualized.

Yolk sac:  Not visualized.

Embryo:  Not visualized.

Cardiac Activity: Not visualized.

Heart Rate: Not visualized.

MSD: Not visualized.

CRL:  Not visualized.

Subchorionic hemorrhage:  None visualized.

Maternal uterus/adnexae: Uterus is normal in size, shape and
position as the endometrium measures 12.2 mm in thickness. There is
moderate mobile heterogeneous debris within the endometrial cavity
extending to the endocervical canal likely hemorrhagic debris.
Patient passed moderate clots between the transabdominal and
endovaginal scanning. No free pelvic fluid. Ovaries are within
normal with normal color Doppler flow.
IMPRESSION: No evidence of intrauterine gestation. Moderate heterogeneous mobile
debris within the endometrial and endocervical canal compatible with
hemorrhagic debris.

Normal ovaries.  No free fluid.

## 2018-09-01 NOTE — L&D Delivery Note (Signed)
Delivery Note At 12:45 PM a viable and healthy female was delivered via Vaginal, Spontaneous (Presentation: vtx;  OA).  APGAR: 9, 9; weight pending .   Placenta status:spontaneous intact not sent , .  Cord:  CAN x 2 reducible with the following complications: none.  Cord pH: n/a  Anesthesia:  local Episiotomy: None Lacerations: 2nd degree;Perineal;Sulcus Suture Repair: 3.0 chromic Est. Blood Loss (mL): 405  Mom to postpartum.  Baby to Couplet care / Skin to Skin.  Maryem Shuffler A Mervil Wacker 03/18/2019, 1:06 PM

## 2018-09-03 LAB — OB RESULTS CONSOLE HIV ANTIBODY (ROUTINE TESTING): HIV: NONREACTIVE

## 2018-09-03 LAB — OB RESULTS CONSOLE RPR: RPR: NONREACTIVE

## 2018-09-03 LAB — OB RESULTS CONSOLE HEPATITIS B SURFACE ANTIGEN: Hepatitis B Surface Ag: NEGATIVE

## 2018-09-03 LAB — OB RESULTS CONSOLE RUBELLA ANTIBODY, IGM: Rubella: IMMUNE

## 2019-03-04 LAB — OB RESULTS CONSOLE GBS: GBS: POSITIVE

## 2019-03-18 ENCOUNTER — Encounter (HOSPITAL_COMMUNITY): Payer: Self-pay

## 2019-03-18 ENCOUNTER — Other Ambulatory Visit: Payer: Self-pay

## 2019-03-18 ENCOUNTER — Inpatient Hospital Stay (HOSPITAL_COMMUNITY)
Admission: AD | Admit: 2019-03-18 | Discharge: 2019-03-20 | DRG: 806 | Disposition: A | Discharge status: Home / Self Care | Payer: Self-pay | Attending: Obstetrics and Gynecology | Admitting: Obstetrics and Gynecology

## 2019-03-18 DIAGNOSIS — D62 Acute posthemorrhagic anemia: Secondary | ICD-10-CM | POA: Diagnosis not present

## 2019-03-18 DIAGNOSIS — O99824 Streptococcus B carrier state complicating childbirth: Secondary | ICD-10-CM | POA: Diagnosis present

## 2019-03-18 DIAGNOSIS — Z3A39 39 weeks gestation of pregnancy: Secondary | ICD-10-CM

## 2019-03-18 DIAGNOSIS — O9081 Anemia of the puerperium: Secondary | ICD-10-CM | POA: Diagnosis not present

## 2019-03-18 DIAGNOSIS — Z1159 Encounter for screening for other viral diseases: Secondary | ICD-10-CM

## 2019-03-18 LAB — CBC
HCT: 38.2 % (ref 36.0–46.0)
Hemoglobin: 12.7 g/dL (ref 12.0–15.0)
MCH: 31.3 pg (ref 26.0–34.0)
MCHC: 33.2 g/dL (ref 30.0–36.0)
MCV: 94.1 fL (ref 80.0–100.0)
Platelets: 240 10*3/uL (ref 150–400)
RBC: 4.06 MIL/uL (ref 3.87–5.11)
RDW: 15.3 % (ref 11.5–15.5)
WBC: 10.9 10*3/uL — ABNORMAL HIGH (ref 4.0–10.5)
nRBC: 0 % (ref 0.0–0.2)

## 2019-03-18 LAB — TYPE AND SCREEN
ABO/RH(D): A POS
Antibody Screen: NEGATIVE

## 2019-03-18 LAB — ABO/RH: ABO/RH(D): A POS

## 2019-03-18 LAB — SARS CORONAVIRUS 2 BY RT PCR (HOSPITAL ORDER, PERFORMED IN ~~LOC~~ HOSPITAL LAB): SARS Coronavirus 2: NEGATIVE

## 2019-03-18 MED ORDER — OXYCODONE HCL 5 MG PO TABS: 10.0000 mg | ORAL_TABLET | ORAL | Status: DC | PRN

## 2019-03-18 MED ORDER — ZOLPIDEM TARTRATE 5 MG PO TABS: 5.0000 mg | ORAL_TABLET | Freq: Every evening | ORAL | Status: DC | PRN

## 2019-03-18 MED ORDER — ACETAMINOPHEN 325 MG PO TABS: 650.0000 mg | ORAL_TABLET | ORAL | Status: DC | PRN

## 2019-03-18 MED ORDER — FERROUS SULFATE 325 (65 FE) MG PO TABS
325.0000 mg | ORAL_TABLET | Freq: Two times a day (BID) | ORAL | Status: DC
  Administered 2019-03-18 – 2019-03-20 (×4): 325 mg via ORAL
  Filled 2019-03-18 (×4): qty 1

## 2019-03-18 MED ORDER — ONDANSETRON HCL 4 MG/2ML IJ SOLN: 4.0000 mg | INTRAMUSCULAR | Status: DC | PRN

## 2019-03-18 MED ORDER — LACTATED RINGERS IV SOLN: INTRAVENOUS | Status: DC

## 2019-03-18 MED ORDER — LACTATED RINGERS IV SOLN: 500.0000 mL | Freq: Once | INTRAVENOUS | Status: DC

## 2019-03-18 MED ORDER — PHENYLEPHRINE 40 MCG/ML (10ML) SYRINGE FOR IV PUSH (FOR BLOOD PRESSURE SUPPORT): 80.0000 ug | PREFILLED_SYRINGE | INTRAVENOUS | Status: DC | PRN

## 2019-03-18 MED ORDER — EPHEDRINE 5 MG/ML INJ: 10.0000 mg | INTRAVENOUS | Status: DC | PRN

## 2019-03-18 MED ORDER — WITCH HAZEL-GLYCERIN EX PADS
1.0000 "application " | MEDICATED_PAD | CUTANEOUS | Status: DC | PRN
  Administered 2019-03-18: 1 via TOPICAL

## 2019-03-18 MED ORDER — MEDROXYPROGESTERONE ACETATE 150 MG/ML IM SUSP: 150.0000 mg | INTRAMUSCULAR | Status: DC | PRN

## 2019-03-18 MED ORDER — DIPHENHYDRAMINE HCL 25 MG PO CAPS: 25.0000 mg | ORAL_CAPSULE | Freq: Four times a day (QID) | ORAL | Status: DC | PRN

## 2019-03-18 MED ORDER — FLEET ENEMA 7-19 GM/118ML RE ENEM: 1.0000 | ENEMA | RECTAL | Status: DC | PRN

## 2019-03-18 MED ORDER — FENTANYL-BUPIVACAINE-NACL 0.5-0.125-0.9 MG/250ML-% EP SOLN: 12.0000 mL/h | EPIDURAL | Status: DC | PRN

## 2019-03-18 MED ORDER — COCONUT OIL OIL: 1.0000 "application " | TOPICAL_OIL | Status: DC | PRN

## 2019-03-18 MED ORDER — IBUPROFEN 600 MG PO TABS
600.0000 mg | ORAL_TABLET | Freq: Four times a day (QID) | ORAL | Status: DC
  Administered 2019-03-18 – 2019-03-20 (×8): 600 mg via ORAL
  Filled 2019-03-18 (×8): qty 1

## 2019-03-18 MED ORDER — MEASLES, MUMPS & RUBELLA VAC IJ SOLR: 0.5000 mL | Freq: Once | INTRAMUSCULAR | Status: DC

## 2019-03-18 MED ORDER — ONDANSETRON HCL 4 MG/2ML IJ SOLN: 4.0000 mg | Freq: Four times a day (QID) | INTRAMUSCULAR | Status: DC | PRN

## 2019-03-18 MED ORDER — SOD CITRATE-CITRIC ACID 500-334 MG/5ML PO SOLN: 30.0000 mL | ORAL | Status: DC | PRN

## 2019-03-18 MED ORDER — PRENATAL MULTIVITAMIN CH
1.0000 | ORAL_TABLET | Freq: Every day | ORAL | Status: DC
  Administered 2019-03-19 – 2019-03-20 (×2): 1 via ORAL
  Filled 2019-03-18 (×2): qty 1

## 2019-03-18 MED ORDER — OXYTOCIN 40 UNITS IN NORMAL SALINE INFUSION - SIMPLE MED
2.5000 [IU]/h | INTRAVENOUS | Status: DC
  Filled 2019-03-18 (×2): qty 1000

## 2019-03-18 MED ORDER — DIBUCAINE (PERIANAL) 1 % EX OINT: 1.0000 "application " | TOPICAL_OINTMENT | CUTANEOUS | Status: DC | PRN

## 2019-03-18 MED ORDER — OXYCODONE HCL 5 MG PO TABS: 5.0000 mg | ORAL_TABLET | ORAL | Status: DC | PRN

## 2019-03-18 MED ORDER — LIDOCAINE HCL (PF) 1 % IJ SOLN
30.0000 mL | INTRAMUSCULAR | Status: AC | PRN
  Administered 2019-03-18: 30 mL via SUBCUTANEOUS
  Filled 2019-03-18: qty 30

## 2019-03-18 MED ORDER — SENNOSIDES-DOCUSATE SODIUM 8.6-50 MG PO TABS
2.0000 | ORAL_TABLET | ORAL | Status: DC
  Administered 2019-03-19 (×2): 2 via ORAL
  Filled 2019-03-18 (×2): qty 2

## 2019-03-18 MED ORDER — LACTATED RINGERS IV SOLN: 500.0000 mL | INTRAVENOUS | Status: DC | PRN

## 2019-03-18 MED ORDER — SIMETHICONE 80 MG PO CHEW: 80.0000 mg | CHEWABLE_TABLET | ORAL | Status: DC | PRN

## 2019-03-18 MED ORDER — OXYTOCIN BOLUS FROM INFUSION
500.0000 mL | Freq: Once | INTRAVENOUS | Status: AC
  Administered 2019-03-18: 500 mL via INTRAVENOUS

## 2019-03-18 MED ORDER — TETANUS-DIPHTH-ACELL PERTUSSIS 5-2.5-18.5 LF-MCG/0.5 IM SUSP
0.5000 mL | Freq: Once | INTRAMUSCULAR | Status: AC
  Administered 2019-03-19: 0.5 mL via INTRAMUSCULAR
  Filled 2019-03-18: qty 0.5

## 2019-03-18 MED ORDER — OXYCODONE-ACETAMINOPHEN 5-325 MG PO TABS: 2.0000 | ORAL_TABLET | ORAL | Status: DC | PRN

## 2019-03-18 MED ORDER — BENZOCAINE-MENTHOL 20-0.5 % EX AERO
1.0000 "application " | INHALATION_SPRAY | CUTANEOUS | Status: DC | PRN
  Administered 2019-03-18 – 2019-03-20 (×2): 1 via TOPICAL
  Filled 2019-03-18 (×2): qty 56

## 2019-03-18 MED ORDER — DIPHENHYDRAMINE HCL 50 MG/ML IJ SOLN: 12.5000 mg | INTRAMUSCULAR | Status: DC | PRN

## 2019-03-18 MED ORDER — ONDANSETRON HCL 4 MG PO TABS: 4.0000 mg | ORAL_TABLET | ORAL | Status: DC | PRN

## 2019-03-18 MED ORDER — CLINDAMYCIN PHOSPHATE 900 MG/50ML IV SOLN
900.0000 mg | Freq: Once | INTRAVENOUS | Status: AC
  Administered 2019-03-18: 900 mg via INTRAVENOUS
  Filled 2019-03-18 (×2): qty 50

## 2019-03-18 MED ORDER — OXYCODONE-ACETAMINOPHEN 5-325 MG PO TABS: 1.0000 | ORAL_TABLET | ORAL | Status: DC | PRN

## 2019-03-18 NOTE — H&P (Signed)
OB ADMISSION/ HISTORY & PHYSICAL:  Admission Date: 03/18/2019  9:45 AM  Admit Diagnosis: 38 weeks / labor onset  Martha Delgado is a 28 y.o. female presenting for onset of labor.  Prenatal History: G3P1001   EDC : 03/29/2019 Prenatal care at [facility]  Primary Ob Provider: Pamala Delgado Prenatal course uncomplicated  Prenatal Labs: ABO, Rh:  A positive Antibody:  negative Rubella: Immune (01/03 0000)  RPR: Nonreactive (01/03 0000)  HBsAg: Negative (01/03 0000)  HIV: Non-reactive (01/03 0000)  GTT: passed GBS: Positive (07/03 0000)   OB History  Gravida Para Term Preterm AB Living  3 1 1     1   SAB TAB Ectopic Multiple Live Births        0 1    # Outcome Date GA Lbr Len/2nd Weight Sex Delivery Anes PTL Lv  3 Current           2 Term 03/23/17 [redacted]w[redacted]d 04:11 / 02:02 3255 g F Vag-Spont EPI  LIV     Birth Comments: opened blister on back of left hand  1 Gravida            Medical / Surgical History: Past medical history:  Past Medical History:  Diagnosis Date  . Diabetes mellitus without complication ([place])    GDM-diet control    Past surgical history:  Past Surgical History:  Procedure Laterality Date  . SKIN GRAFT     mouth   Family History:  Family History  Problem Relation Age of Onset  . Hyperlipidemia Maternal Grandmother   . Heart attack Maternal Grandmother   . Stroke Maternal Grandmother   . Kidney disease Maternal Grandmother   . Thyroid disease Maternal Grandmother     Social History:  reports that she has never smoked. She has never used smokeless tobacco. She reports that she does not drink alcohol or use drugs.  Allergies: Penicillins   Current Medications at time of admission:  Prior to Admission medications   Medication Sig Start Date End Date Taking? Authorizing Provider  Prenatal Vit-Fe Fumarate-FA (PRENATAL MULTIVITAMIN) TABS tablet Take 1 tablet by mouth daily at 12 noon.   Yes [provider]  ibuprofen (ADVIL,MOTRIN)  600 MG tablet Take 1 tablet (600 mg total) by mouth every 6 (six) hours. 03/24/17   Martha Delgado, CNM   Review of Systems: active FM onset of ctx @ 0700 currently every 5 minutes No LOF bloody show (+)  Physical Exam: VS: Blood pressure 129/82, pulse 72, temperature 98.4 F (36.9 C), temperature source Oral, resp. rate 18, height 5\' 5"  (1.651 m), weight 63.4 kg, SpO2 100 %, unknown if currently breastfeeding. alert and oriented, appears uncomfortable heart:RRR lung fields clear with ausculation abdomen soft and non-tender, non-distended  uterus gravid and non-tender / AGA extremities: no edema  cervical exam:Dilation: 7 Effacement (%): 80 Station: -2 Exam by:: Martha Lytle RN   FHR: baseline rate 130 / variability moderate / accelerations + / no decelerations TOCO: 3-4  Assessment: [redacted] weeks gestation active stage of labor FHR category 1  Plan:  Admit in active labor - hx rapid labor Clindamycin for GBS (+) carrier status (PCN allergy with hives) declines epidural at this time  Dr Martha Delgado notified of admission / plan of care - CNM to follow until MD OR case completed  Martha Delgado CNM, MSN, [facility] 03/18/2019, 11:31 AM

## 2019-03-18 NOTE — Progress Notes (Signed)
Subjective:   painful ctx - coping well - denies urge to push, just more pressure declines analgesia or epidural  Objective:   VS: BP 128/77   Pulse 100   Temp 98.4 F (36.9 C) (Oral)   Resp 20   Ht 5\' 5"  (1.651 m)   Wt 63.4 kg   SpO2 100% Comment: room air  BMI 23.25 kg/m  FHR : baseline 130 / variability moerate / accelerations + / no decelerations Toco: contractions every 2-4 minutes / strong   cervix : 10cm / 100% / vtx +1  membranes: BBOW  Assessment /Plan:  active labor FHR category 1 update MD with status anticipate SVD in next hour   Flint Melter, MSN, [facility] 03/18/2019, 12:18 PM

## 2019-03-18 NOTE — MAU Note (Addendum)
Pt c/o contractions since 0700, every 5 mins. Denies LOF, some scant bloody show.  +FM.  Was 4 cm on Monday at prenatal visit.

## 2019-03-19 ENCOUNTER — Encounter (HOSPITAL_COMMUNITY): Payer: Self-pay | Admitting: *Deleted

## 2019-03-19 LAB — RPR: RPR Ser Ql: NONREACTIVE

## 2019-03-19 LAB — CBC
HCT: 32.4 % — ABNORMAL LOW (ref 36.0–46.0)
Hemoglobin: 10.5 g/dL — ABNORMAL LOW (ref 12.0–15.0)
MCH: 30.8 pg (ref 26.0–34.0)
MCHC: 32.4 g/dL (ref 30.0–36.0)
MCV: 95 fL (ref 80.0–100.0)
Platelets: 191 10*3/uL (ref 150–400)
RBC: 3.41 MIL/uL — ABNORMAL LOW (ref 3.87–5.11)
RDW: 15.5 % (ref 11.5–15.5)
WBC: 10.2 10*3/uL (ref 4.0–10.5)
nRBC: 0 % (ref 0.0–0.2)

## 2019-03-19 NOTE — Lactation Note (Signed)
This note was copied from a baby's chart. Lactation Consultation Note  Patient Name: Martha Delgado [id number] Date: 03/19/2019 Reason for consult: Initial assessment;Infant weight loss;Early term 51-38.6wks  10 hours old ETI female who is being exclusively BF by his mother, she's a P2 and experienced BF. Baby is at 3% weight loss. She was able to BF her first baby for 9 months and she's already familiar with hand expression. When [facility] assisted with hand expression, mom was able to get several big drops of colostrum off both breasts. She has a battery operated DEBP at home.  Offered assistance with latch and mom agreed to have baby STS, he was already cueing, baby is already cluster feeding. LC took baby STS to mom's right breast and he was able to latch after a few attempts, LC had to get him off the breast a couple of time because baby was too eager and was latching too shallow. Showed mom how to do the deep asymmetrical latch, baby got the entire nipple/areola complex and started sucking rhythmically, a few audible swallows noted. Baby still feeding when exiting the room at the 6 minutes mark. Reviewed normal newborn behavior, cluster feeding and feeding cues.  Feeding plan:  1. Encouraged mom to keep feeding baby STS 8-12 times/24 hours or sooner if feeding cues are present 2. Hand expression and spoon feeding were also strongly encouraged  BF brochure, BF resources and feeding diary were reviewed. Parents reported all questions and concerns were answered, they're both aware of [facility] OP services and will call PRN.  Maternal Data Formula Feeding for Exclusion: No Has patient been taught Hand Expression?: Yes Does the patient have breastfeeding experience prior to this delivery?: Yes  Feeding Feeding Type: Breast Fed  LATCH Score Latch: Grasps breast easily, tongue down, lips flanged, rhythmical sucking.  Audible Swallowing: A few with stimulation  Type of Nipple: Everted at rest and after  stimulation  Comfort (Breast/Nipple): Soft / non-tender  Hold (Positioning): Assistance needed to correctly position infant at breast and maintain latch.  LATCH Score: 8  Interventions Interventions: Breast feeding basics reviewed;Assisted with latch;Skin to skin;Breast massage;Hand express;Breast compression;Adjust position;Support pillows  Lactation Tools Discussed/Used WIC Program: No   Consult Status Consult Status: PRN Follow-up type: In-patient    Amiera Herzberg Francene Boyers 03/19/2019, 10:07 PM

## 2019-03-19 NOTE — Progress Notes (Signed)
PPD #1, SVD, 2nd degree, sulcus repair, baby boy "Martha Delgado"  S:  Reports feeling well; tired - did not sleep much last night             Tolerating po/ No nausea or vomiting / Denies dizziness or SOB             Bleeding is getting lighter             Pain controlled with Motrin             Up ad lib / ambulatory / voiding QS without difficulty   Newborn breast feeding  / Circumcision - planning prior to discharge  O:               VS: BP 114/76 (BP Location: Right Arm)   Pulse 65   Temp 98.2 F (36.8 C) (Oral)   Resp 17   Ht 5\' 5"  (1.651 m)   Wt 63.4 kg   SpO2 100%   Breastfeeding Unknown   BMI 23.25 kg/m    LABS:              Recent Labs    03/18/19 1041 03/19/19 0350  WBC 10.9* 10.2  HGB 12.7 10.5*  PLT 240 191               Blood type: --/--/A POS, A POS Performed at [facility], [place] [address], [place], [place] [address]  [phone])  Rubella: Immune (01/03 0000)                     I&O: Intake/Output      07/17 0701 - 07/18 0700 07/18 0701 - 07/19 0700   Blood 405    Total Output 405    Net -405                       Physical Exam:             Alert and oriented X3  Lungs: Clear and unlabored  Heart: regular rate and rhythm / no murmurs  Abdomen: soft, non-tender, non-distended              Fundus: firm, non-tender, U-1  Perineum: well approximated 2nd degree and sulcus repair, (+) edema noted  Lochia: moderate amount of lochia rubra noted on pad; ice pack pad in place   Extremities: no edema, no calf pain or tenderness    A: PPD # 1, SVD  ABL Anemia - stable on oral FE  Doing well - stable status  P: Routine post partum orders  Lactation support   May shower today  Circ to be completed tomorrow by Dr. Garwin Brothers   Anticipate d/c home tomorrow due to inadequate treatment for GBS (+)   Lars Pinks, MSN, CNM [facility]

## 2019-03-20 MED ORDER — BENZOCAINE-MENTHOL 20-0.5 % EX AERO: 1.0000 "application " | INHALATION_SPRAY | CUTANEOUS | Status: AC | PRN

## 2019-03-20 MED ORDER — FERROUS SULFATE 325 (65 FE) MG PO TABS: 325.0000 mg | ORAL_TABLET | Freq: Two times a day (BID) | ORAL | 3 refills | Status: AC

## 2019-03-20 MED ORDER — ACETAMINOPHEN 325 MG PO TABS: 650.0000 mg | ORAL_TABLET | ORAL | Status: AC | PRN

## 2019-03-20 NOTE — Progress Notes (Signed)
PPD # 2, SVD, 2nd degree perineal and vaginal sulcus repair, baby boy "Micah"  S:  Reports feeling well. Bleeding better             BOY, s/p circumcision, breast feeding well. Under observation since no GBS prophylaxis due to rapid labor O:               VS: BP 109/74   Pulse 75   Temp 97.8 F (36.6 C) (Oral)   Resp 16   Ht 5\' 5"  (1.651 m)   Wt 63.4 kg   SpO2 100%   Breastfeeding Unknown   BMI 23.25 kg/m    LABS:              Recent Labs    03/18/19 1041 03/19/19 0350  WBC 10.9* 10.2  HGB 12.7 10.5*  PLT 240 191               Blood type: --/--/A POS, A POS Performed at [facility] [address] [place], [place] [address]  [phone])  Rubella: Immune (01/03 0000)                     I&O: Intake/Output      07/18 0701 - 07/19 0700 07/19 0701 - 07/20 0700   Blood     Total Output     Net                        Physical Exam:             Alert and oriented X3  Lungs: Clear and unlabored  Heart: regular rate and rhythm / no murmurs  Abdomen: soft, non-tender, non-distended              Fundus: firm, non-tender, U-1  Perineum: well approximated 2nd degree and sulcus repair, (+) edema noted  Lochia: moderate amount of lochia rubra noted on pad; ice pack pad in place   Extremities: no edema, no calf pain or tenderness    A: PPD # 2, SVD  ABL Anemia - stable on oral FE  Doing well - stable status  P: Routine post partum orders  Discharge home F/up office Dr Pamala Hurry 6 wks PP care reviewed   Azucena Fallen, MD  [facility]

## 2019-03-20 NOTE — Lactation Note (Signed)
This note was copied from a baby's chart. Lactation Consultation Note  Patient Name: Martha Delgado [id number] Date: 03/20/2019 Reason for consult: Follow-up assessment   P2, Baby recently bf for 15 min per mother. She denies questions or concerns. Reviewed engorgement care and monitoring voids/stools. Feed on demand approximately 8-12 times per day.      Maternal Data    Feeding Feeding Type: Breast Fed  LATCH Score                   Interventions Interventions: Breast feeding basics reviewed  Lactation Tools Discussed/Used     Consult Status Consult Status: Complete Date: 03/20/19    Vivianne Master [facility] 03/20/2019, 9:29 AM

## 2019-03-20 NOTE — Discharge Summary (Signed)
Obstetric Discharge Summary Reason for Admission: onset of labor 40wks, GBS(+). Advanced active labor at admission Prenatal Procedures: ultrasound for anatomy. Uncomplicated prenatal care  Intrapartum Procedures: spontaneous vaginal delivery and 2nd drgree perineal laceration and vaginal sulcus laceration Postpartum Procedures: none Complications-Operative and Postpartum: None   CBC Latest Ref Rng & Units 03/19/2019 03/18/2019   WBC 4.0 - 10.5 K/uL 10.2 10.9(H)   Hemoglobin 12.0 - 15.0 g/dL 10.5(L) 12.7   Hematocrit 36.0 - 46.0 % 32.4(L) 38.2   Platelets 150 - 400 K/uL 191 240     Physical Exam:  General: alert and cooperative Lochia: appropriate Uterine Fundus: firm Incision: healing well DVT Evaluation: No evidence of DVT seen on physical exam.  Discharge Diagnoses: Term Pregnancy-delivered  Discharge Information: Date: 03/20/2019 Activity: pelvic rest Diet: routine Medications: None, PNV and Ibuprofen Condition: stable Instructions: refer to practice specific booklet Discharge to: home Follow-up Information    Aloha Gell, MD Follow up in 6 week(s).   Specialty: Obstetrics and Gynecology Contact information: [place] [address] [phone]           Newborn Data: Live born female  Birth Weight: 8 lb 6.2 oz (3805 g) APGAR: 49, 9  Newborn Delivery   Birth date/time: 03/18/2019 12:45:00 Delivery type: Vaginal, Spontaneous      Home with mother.  Elveria Royals 03/20/2019, 11:45 AM

## 2019-03-20 NOTE — Discharge Instructions (Signed)
